# Patient Record
Sex: Male | Born: 2000 | Race: White | Hispanic: No | Marital: Single | Smoking: Current every day smoker
Health system: Southern US, Community
[De-identification: ages and names within clinical notes are randomized; demographics above are authoritative.]

## PROBLEM LIST (undated history)

## (undated) HISTORY — PX: TONSILLECTOMY: SUR1361

---
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
## 2018-08-06 IMAGING — CT CT ABD-PELV W/ CM
2 of 4 series · 16 of 46 positions shown, 18 images · IV contrast (APPLIED)
Comparison: None available.

CLINICAL DATA: Initial evaluation for acute right lower quadrant
abdominal pain, nausea vomiting, tenderness.

EXAM:
CT ABDOMEN AND PELVIS WITH CONTRAST
TECHNIQUE: Multidetector CT imaging of the abdomen and pelvis was performed
using the standard protocol following bolus administration of
intravenous contrast.
CONTRAST:  100mL [id number] IOPAMIDOL ([id number]) INJECTION 61%

[Series 2: routine abd/pel with · axial · 0.72mm/px · z∈[-950,-515]mm · 13 of 95 slices shown, 15 images]
[im 4/95  soft-tissue]
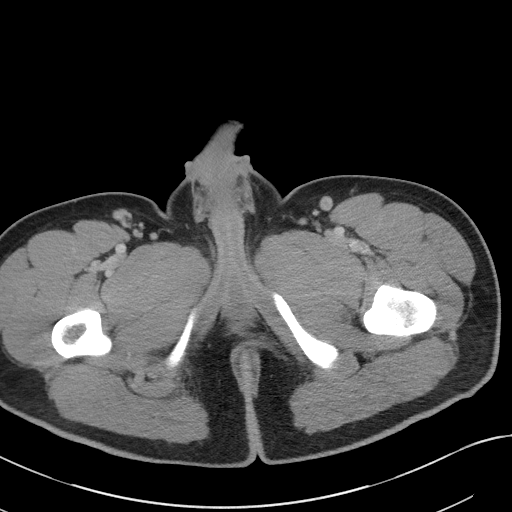
[im 4/95  bone]
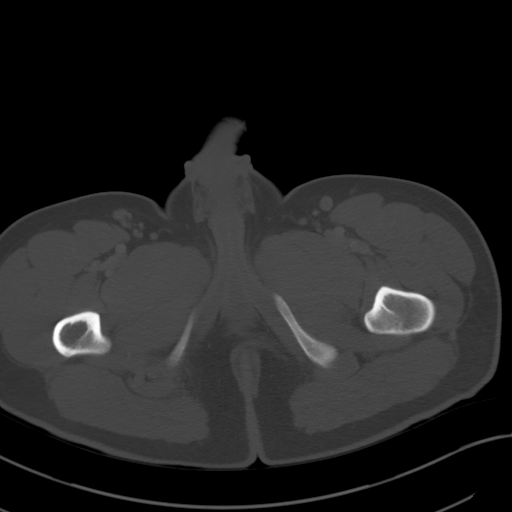
[im 12/95  soft-tissue]
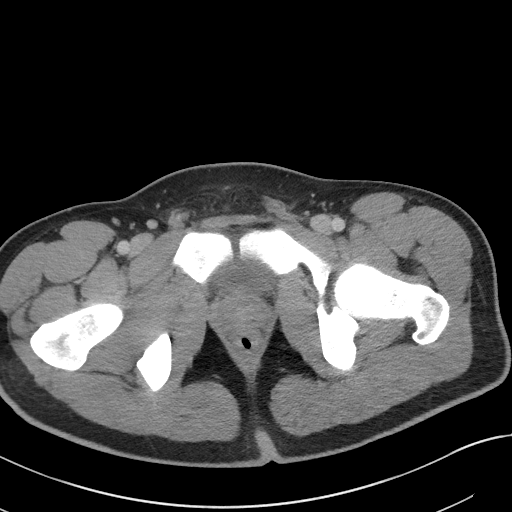
[im 20/95  soft-tissue]
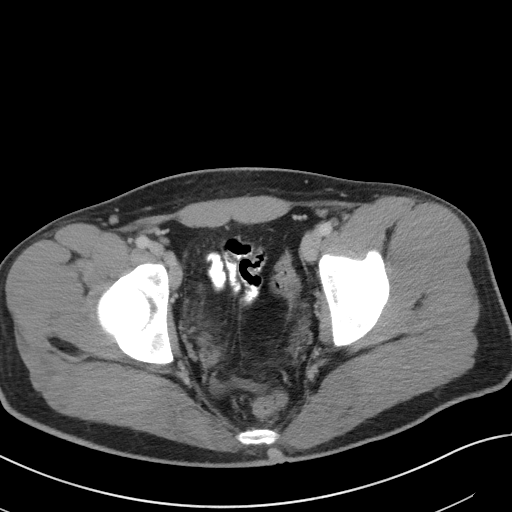
[im 28/95  soft-tissue]
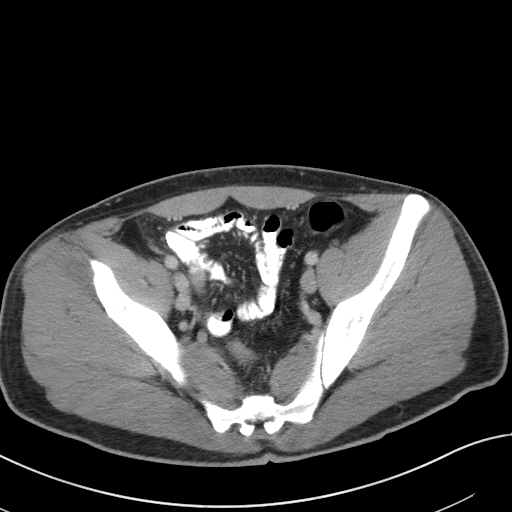
[im 32/95  soft-tissue]
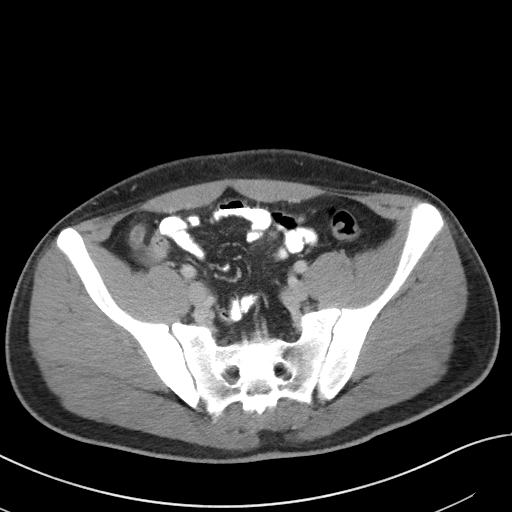
[im 40/95  soft-tissue]
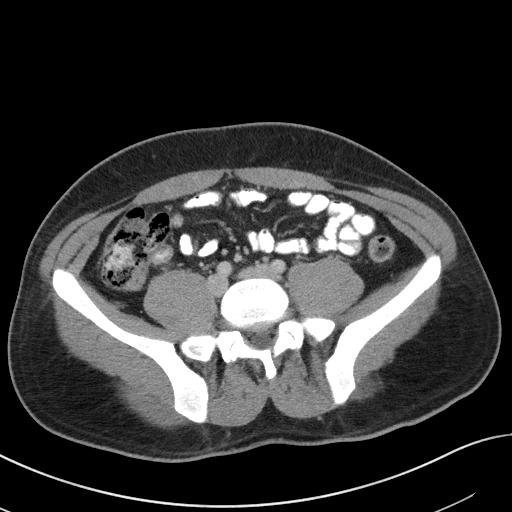
[im 48/95  soft-tissue]
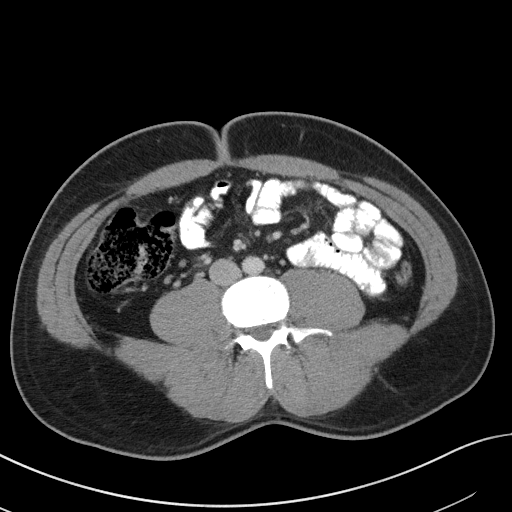
[im 55/95  soft-tissue]
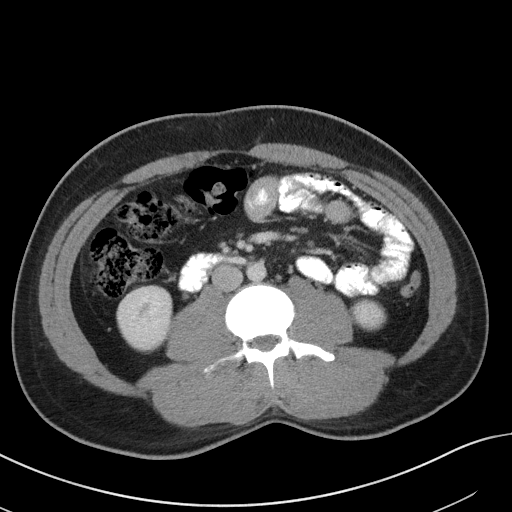
[im 63/95  soft-tissue]
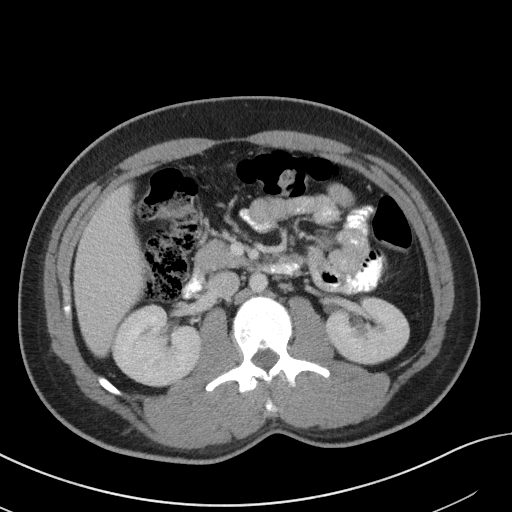
[im 63/95  bone]
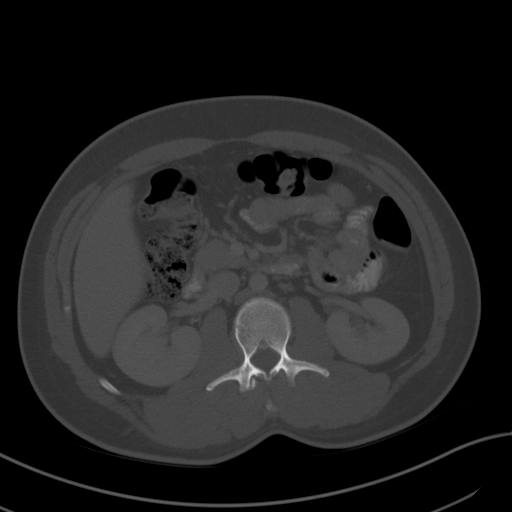
[im 67/95  soft-tissue]
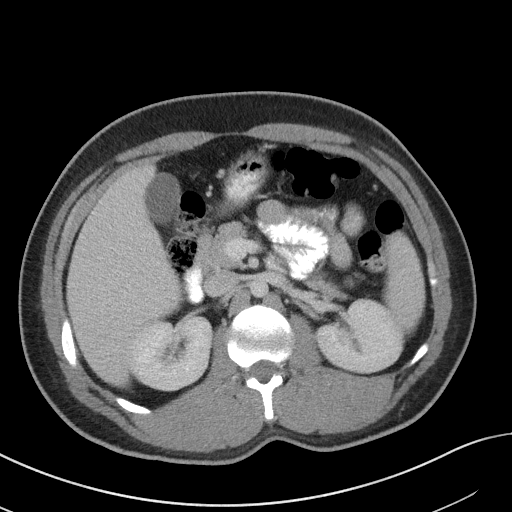
[im 75/95  soft-tissue]
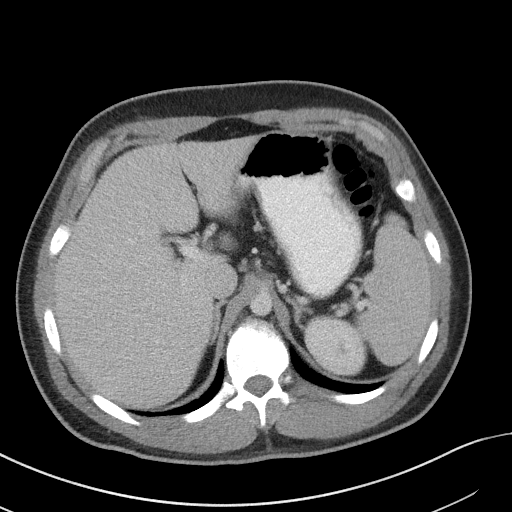
[im 83/95  soft-tissue]
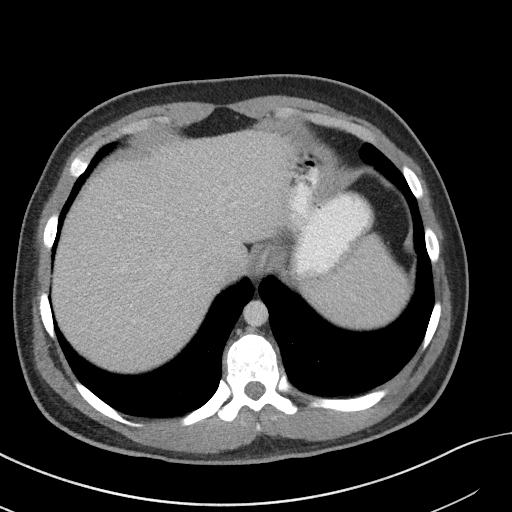
[im 91/95  soft-tissue]
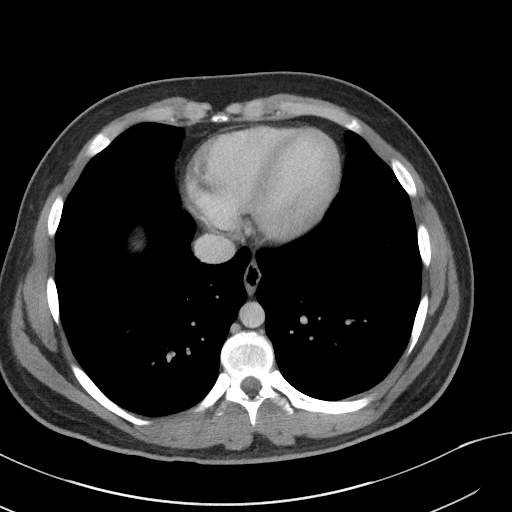

[Series 5: coronal st · coronal · 0.68mm/px · 3 of 91 slices shown]
[im 31/91  soft-tissue]
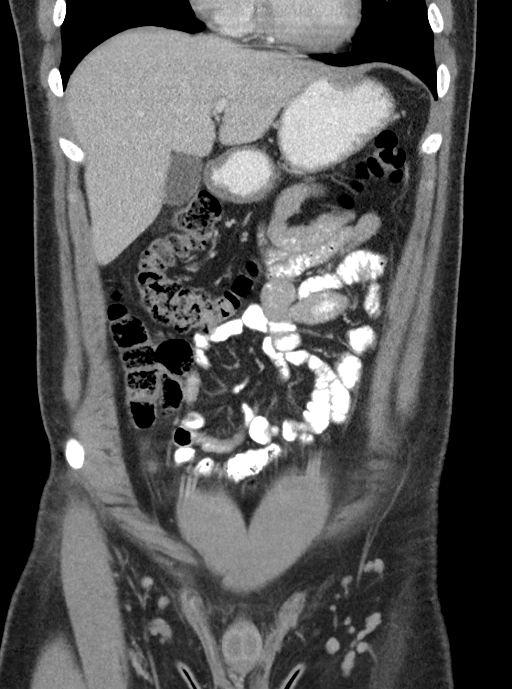
[im 41/91  soft-tissue]
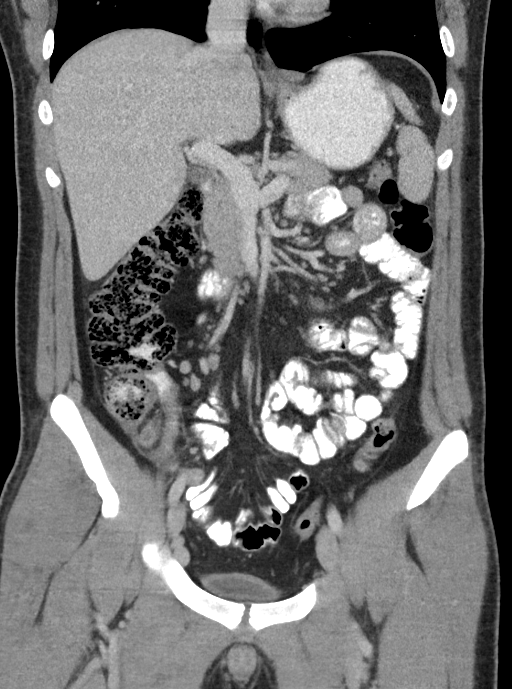
[im 51/91  soft-tissue]
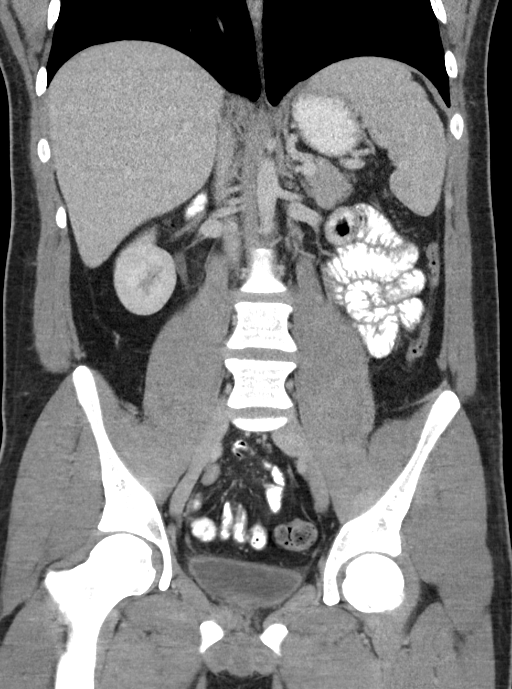

[16 of 46 positions shown; findings below may reference images not displayed]

FINDINGS: Lower chest: Visualized lung bases are clear.

Hepatobiliary: Liver demonstrates a normal contrast enhanced
appearance. Gallbladder normal. No biliary dilatation.

Pancreas: Pancreas within normal limits.

Spleen: Spleen within normal limits.

Adrenals/Urinary Tract: Adrenal glands are normal. Kidneys equal in
size with symmetric enhancement. No nephrolithiasis, hydronephrosis,
or focal enhancing renal mass. No hydroureter. Bladder partially
distended without acute abnormality.

Stomach/Bowel: Stomach within normal limits. No evidence for bowel
obstruction. Appendix well visualized within the right lower
quadrant, extending inferiorly from the cecum. Appendix is dilated
up to 10 mm with associated mucosal enhancement and periappendiceal
fat stranding, consistent with acute appendicitis. No evidence for
perforation or other complication. Mild wall thickening within the
adjacent ilium likely reactive. No other acute inflammatory changes
about the bowels.

Vascular/Lymphatic: Normal intravascular enhancement seen throughout
the intra-abdominal aorta and its branch vessels. No adenopathy.

Reproductive: Prostate normal.

Other: Small volume free fluid within the pelvis, likely reactive.
No free intraperitoneal air.

Musculoskeletal: No acute osseous abnormality. No worrisome lytic or
blastic osseous lesions.
IMPRESSION: Findings consistent with acute appendicitis. No evidence for
perforation or other complication.
